# Patient Record
Sex: Female | Born: 1945 | Race: White | Hispanic: No | Marital: Married | Smoking: Never smoker
Health system: Southern US, Community
[De-identification: ages and names within clinical notes are randomized; demographics above are authoritative.]

## PROBLEM LIST (undated history)

## (undated) DIAGNOSIS — C50919 Malignant neoplasm of unspecified site of unspecified female breast: Secondary | ICD-10-CM

## (undated) DIAGNOSIS — I10 Essential (primary) hypertension: Secondary | ICD-10-CM

## (undated) DIAGNOSIS — Z9889 Other specified postprocedural states: Secondary | ICD-10-CM

## (undated) DIAGNOSIS — L719 Rosacea, unspecified: Secondary | ICD-10-CM

## (undated) DIAGNOSIS — E785 Hyperlipidemia, unspecified: Secondary | ICD-10-CM

## (undated) HISTORY — DX: Malignant neoplasm of unspecified site of unspecified female breast: C50.919

## (undated) HISTORY — PX: BREAST MAMMOSITE: SHX5264

## (undated) HISTORY — DX: Other specified postprocedural states: Z98.890

## (undated) HISTORY — DX: Hyperlipidemia, unspecified: E78.5

## (undated) HISTORY — PX: BREAST LUMPECTOMY: SHX2

## (undated) HISTORY — DX: Essential (primary) hypertension: I10

## (undated) HISTORY — DX: Rosacea, unspecified: L71.9

---

## 2001-06-05 ENCOUNTER — Other Ambulatory Visit: Admission: RE | Admit: 2001-06-05 | Discharge: 2001-06-05 | Payer: Self-pay | Admitting: Obstetrics and Gynecology

## 2001-12-25 ENCOUNTER — Ambulatory Visit (HOSPITAL_COMMUNITY): Admission: RE | Admit: 2001-12-25 | Discharge: 2001-12-25 | Payer: Self-pay | Admitting: Gastroenterology

## 2002-06-06 ENCOUNTER — Other Ambulatory Visit: Admission: RE | Admit: 2002-06-06 | Discharge: 2002-06-06 | Payer: Self-pay | Admitting: Obstetrics and Gynecology

## 2003-06-25 ENCOUNTER — Other Ambulatory Visit: Admission: RE | Admit: 2003-06-25 | Discharge: 2003-06-25 | Payer: Self-pay | Admitting: Obstetrics and Gynecology

## 2003-09-04 ENCOUNTER — Emergency Department (HOSPITAL_COMMUNITY): Admission: EM | Admit: 2003-09-04 | Discharge: 2003-09-04 | Payer: Self-pay | Admitting: Emergency Medicine

## 2004-08-30 HISTORY — PX: BREAST LUMPECTOMY: SHX2

## 2005-06-08 ENCOUNTER — Encounter: Admission: RE | Admit: 2005-06-08 | Discharge: 2005-06-08 | Payer: Self-pay | Admitting: Internal Medicine

## 2005-07-30 ENCOUNTER — Encounter: Admission: RE | Admit: 2005-07-30 | Discharge: 2005-07-30 | Payer: Self-pay | Admitting: Surgery

## 2005-08-26 ENCOUNTER — Encounter (INDEPENDENT_AMBULATORY_CARE_PROVIDER_SITE_OTHER): Payer: Self-pay | Admitting: Specialist

## 2005-08-26 ENCOUNTER — Ambulatory Visit (HOSPITAL_BASED_OUTPATIENT_CLINIC_OR_DEPARTMENT_OTHER): Admission: RE | Admit: 2005-08-26 | Discharge: 2005-08-26 | Payer: Self-pay | Admitting: Surgery

## 2005-08-26 ENCOUNTER — Ambulatory Visit (HOSPITAL_COMMUNITY): Admission: RE | Admit: 2005-08-26 | Discharge: 2005-08-26 | Payer: Self-pay | Admitting: Surgery

## 2005-09-06 ENCOUNTER — Ambulatory Visit: Payer: Self-pay | Admitting: Oncology

## 2005-09-07 ENCOUNTER — Ambulatory Visit: Admission: RE | Admit: 2005-09-07 | Discharge: 2005-11-15 | Payer: Self-pay | Admitting: Radiation Oncology

## 2005-10-12 ENCOUNTER — Ambulatory Visit (HOSPITAL_COMMUNITY): Admission: RE | Admit: 2005-10-12 | Discharge: 2005-10-12 | Payer: Self-pay | Admitting: Surgery

## 2005-10-12 ENCOUNTER — Encounter (INDEPENDENT_AMBULATORY_CARE_PROVIDER_SITE_OTHER): Payer: Self-pay | Admitting: *Deleted

## 2005-11-12 ENCOUNTER — Ambulatory Visit: Payer: Self-pay | Admitting: Oncology

## 2006-02-08 ENCOUNTER — Ambulatory Visit: Payer: Self-pay | Admitting: Oncology

## 2006-02-10 LAB — CBC WITH DIFFERENTIAL/PLATELET
BASO%: 0.5 % (ref 0.0–2.0)
Basophils Absolute: 0 10*3/uL (ref 0.0–0.1)
EOS%: 3.7 % (ref 0.0–7.0)
HGB: 13.2 g/dL (ref 11.6–15.9)
MCH: 31.4 pg (ref 26.0–34.0)
MCHC: 34 g/dL (ref 32.0–36.0)
MCV: 92.4 fL (ref 81.0–101.0)
MONO%: 7.2 % (ref 0.0–13.0)
RBC: 4.21 10*6/uL (ref 3.70–5.32)
RDW: 14.3 % (ref 11.3–14.5)
lymph#: 1.9 10*3/uL (ref 0.9–3.3)

## 2006-02-11 LAB — COMPREHENSIVE METABOLIC PANEL
ALT: 19 U/L (ref 0–40)
AST: 21 U/L (ref 0–37)
Albumin: 4 g/dL (ref 3.5–5.2)
Alkaline Phosphatase: 48 U/L (ref 39–117)
Calcium: 9.3 mg/dL (ref 8.4–10.5)
Chloride: 102 mEq/L (ref 96–112)
Potassium: 4.6 mEq/L (ref 3.5–5.3)
Sodium: 142 mEq/L (ref 135–145)
Total Protein: 7.3 g/dL (ref 6.0–8.3)

## 2006-02-11 LAB — CANCER ANTIGEN 27.29: CA 27.29: 21 U/mL (ref 0–39)

## 2006-05-10 ENCOUNTER — Ambulatory Visit: Payer: Self-pay | Admitting: Oncology

## 2006-05-12 LAB — CBC WITH DIFFERENTIAL/PLATELET
Basophils Absolute: 0 10*3/uL (ref 0.0–0.1)
Eosinophils Absolute: 0.3 10*3/uL (ref 0.0–0.5)
HGB: 12.9 g/dL (ref 11.6–15.9)
MCV: 92.4 fL (ref 81.0–101.0)
MONO%: 11.1 % (ref 0.0–13.0)
NEUT#: 3.4 10*3/uL (ref 1.5–6.5)
RDW: 13.6 % (ref 11.3–14.5)
lymph#: 2.4 10*3/uL (ref 0.9–3.3)

## 2006-05-12 LAB — COMPREHENSIVE METABOLIC PANEL
ALT: 18 U/L (ref 0–40)
Albumin: 4.2 g/dL (ref 3.5–5.2)
CO2: 27 mEq/L (ref 19–32)
Calcium: 9.2 mg/dL (ref 8.4–10.5)
Chloride: 106 mEq/L (ref 96–112)
Creatinine, Ser: 0.81 mg/dL (ref 0.40–1.20)
Potassium: 4.2 mEq/L (ref 3.5–5.3)

## 2006-05-12 LAB — LACTATE DEHYDROGENASE: LDH: 137 U/L (ref 94–250)

## 2006-05-12 LAB — CANCER ANTIGEN 27.29: CA 27.29: 25 U/mL (ref 0–39)

## 2006-07-28 ENCOUNTER — Ambulatory Visit: Admission: RE | Admit: 2006-07-28 | Discharge: 2006-08-09 | Payer: Self-pay | Admitting: Radiation Oncology

## 2006-07-28 LAB — CBC WITH DIFFERENTIAL/PLATELET
Basophils Absolute: 0 10*3/uL (ref 0.0–0.1)
EOS%: 5.4 % (ref 0.0–7.0)
Eosinophils Absolute: 0.4 10*3/uL (ref 0.0–0.5)
HGB: 13.4 g/dL (ref 11.6–15.9)
MCH: 31.7 pg (ref 26.0–34.0)
NEUT#: 3.7 10*3/uL (ref 1.5–6.5)
RDW: 13.5 % (ref 11.3–14.5)
WBC: 7.2 10*3/uL (ref 3.9–10.0)
lymph#: 2.4 10*3/uL (ref 0.9–3.3)

## 2006-11-08 ENCOUNTER — Ambulatory Visit: Payer: Self-pay | Admitting: Oncology

## 2006-11-11 LAB — CBC WITH DIFFERENTIAL/PLATELET
BASO%: 0.3 % (ref 0.0–2.0)
Basophils Absolute: 0 10*3/uL (ref 0.0–0.1)
Eosinophils Absolute: 0.3 10*3/uL (ref 0.0–0.5)
HCT: 34.5 % — ABNORMAL LOW (ref 34.8–46.6)
HGB: 12 g/dL (ref 11.6–15.9)
MONO#: 0.7 10*3/uL (ref 0.1–0.9)
NEUT#: 4.2 10*3/uL (ref 1.5–6.5)
NEUT%: 52.8 % (ref 39.6–76.8)
WBC: 7.9 10*3/uL (ref 3.9–10.0)
lymph#: 2.7 10*3/uL (ref 0.9–3.3)

## 2006-11-11 LAB — COMPREHENSIVE METABOLIC PANEL
ALT: 14 U/L (ref 0–35)
CO2: 25 mEq/L (ref 19–32)
Calcium: 9.1 mg/dL (ref 8.4–10.5)
Chloride: 106 mEq/L (ref 96–112)
Creatinine, Ser: 0.84 mg/dL (ref 0.40–1.20)

## 2006-11-11 LAB — LACTATE DEHYDROGENASE: LDH: 135 U/L (ref 94–250)

## 2006-11-11 LAB — CANCER ANTIGEN 27.29: CA 27.29: 20 U/mL (ref 0–39)

## 2007-02-10 ENCOUNTER — Ambulatory Visit: Payer: Self-pay | Admitting: Oncology

## 2007-02-14 LAB — LACTATE DEHYDROGENASE: LDH: 158 U/L (ref 94–250)

## 2007-02-14 LAB — CBC WITH DIFFERENTIAL/PLATELET
Eosinophils Absolute: 0.3 10*3/uL (ref 0.0–0.5)
HCT: 38.1 % (ref 34.8–46.6)
LYMPH%: 36.7 % (ref 14.0–48.0)
MCHC: 34.9 g/dL (ref 32.0–36.0)
MCV: 92.2 fL (ref 81.0–101.0)
MONO#: 0.3 10*3/uL (ref 0.1–0.9)
MONO%: 4.8 % (ref 0.0–13.0)
NEUT#: 3.7 10*3/uL (ref 1.5–6.5)
NEUT%: 53.3 % (ref 39.6–76.8)
Platelets: 255 10*3/uL (ref 145–400)
RBC: 4.13 10*6/uL (ref 3.70–5.32)
WBC: 7 10*3/uL (ref 3.9–10.0)

## 2007-02-14 LAB — COMPREHENSIVE METABOLIC PANEL
Alkaline Phosphatase: 47 U/L (ref 39–117)
CO2: 23 mEq/L (ref 19–32)
Creatinine, Ser: 0.76 mg/dL (ref 0.40–1.20)
Glucose, Bld: 140 mg/dL — ABNORMAL HIGH (ref 70–99)
Sodium: 143 mEq/L (ref 135–145)
Total Bilirubin: 0.4 mg/dL (ref 0.3–1.2)

## 2007-02-14 LAB — CANCER ANTIGEN 27.29: CA 27.29: 20 U/mL (ref 0–39)

## 2007-05-25 ENCOUNTER — Encounter: Admission: RE | Admit: 2007-05-25 | Discharge: 2007-05-25 | Payer: Self-pay | Admitting: Oncology

## 2007-06-29 ENCOUNTER — Ambulatory Visit: Payer: Self-pay | Admitting: Oncology

## 2007-07-03 LAB — CANCER ANTIGEN 27.29: CA 27.29: 19 U/mL (ref 0–39)

## 2007-07-03 LAB — COMPREHENSIVE METABOLIC PANEL
ALT: 25 U/L (ref 0–35)
Alkaline Phosphatase: 66 U/L (ref 39–117)
Creatinine, Ser: 0.84 mg/dL (ref 0.40–1.20)
Glucose, Bld: 170 mg/dL — ABNORMAL HIGH (ref 70–99)
Sodium: 142 mEq/L (ref 135–145)
Total Bilirubin: 0.4 mg/dL (ref 0.3–1.2)
Total Protein: 7.3 g/dL (ref 6.0–8.3)

## 2007-07-03 LAB — CBC WITH DIFFERENTIAL/PLATELET
BASO%: 0.1 % (ref 0.0–2.0)
Eosinophils Absolute: 0.2 10*3/uL (ref 0.0–0.5)
HCT: 38.1 % (ref 34.8–46.6)
HGB: 13.3 g/dL (ref 11.6–15.9)
LYMPH%: 33.8 % (ref 14.0–48.0)
MONO#: 0.3 10*3/uL (ref 0.1–0.9)
MONO%: 4.8 % (ref 0.0–13.0)
NEUT#: 3.8 10*3/uL (ref 1.5–6.5)
RDW: 14 % (ref 11.3–14.5)
WBC: 6.5 10*3/uL (ref 3.9–10.0)
lymph#: 2.2 10*3/uL (ref 0.9–3.3)

## 2007-11-02 ENCOUNTER — Ambulatory Visit: Payer: Self-pay | Admitting: Oncology

## 2007-11-06 LAB — CBC WITH DIFFERENTIAL/PLATELET
BASO%: 0.5 % (ref 0.0–2.0)
EOS%: 3.7 % (ref 0.0–7.0)
Eosinophils Absolute: 0.3 10*3/uL (ref 0.0–0.5)
LYMPH%: 35.8 % (ref 14.0–48.0)
MCHC: 34.6 g/dL (ref 32.0–36.0)
MCV: 89.9 fL (ref 81.0–101.0)
MONO%: 7.1 % (ref 0.0–13.0)
NEUT#: 4 10*3/uL (ref 1.5–6.5)
Platelets: 236 10*3/uL (ref 145–400)
RBC: 4.25 10*6/uL (ref 3.70–5.32)
RDW: 13.9 % (ref 11.3–14.5)

## 2007-11-07 LAB — COMPREHENSIVE METABOLIC PANEL
ALT: 18 U/L (ref 0–35)
AST: 19 U/L (ref 0–37)
Albumin: 4.2 g/dL (ref 3.5–5.2)
Alkaline Phosphatase: 79 U/L (ref 39–117)
Glucose, Bld: 92 mg/dL (ref 70–99)
Potassium: 4.5 mEq/L (ref 3.5–5.3)
Sodium: 140 mEq/L (ref 135–145)
Total Bilirubin: 0.4 mg/dL (ref 0.3–1.2)
Total Protein: 7 g/dL (ref 6.0–8.3)

## 2007-11-07 LAB — VITAMIN D 25 HYDROXY (VIT D DEFICIENCY, FRACTURES): Vit D, 25-Hydroxy: 40 ng/mL (ref 30–89)

## 2007-11-09 LAB — VITAMIN D 1,25 DIHYDROXY: Vit D, 1,25-Dihydroxy: 41 pg/mL (ref 6–62)

## 2008-03-04 ENCOUNTER — Ambulatory Visit: Payer: Self-pay | Admitting: Oncology

## 2008-03-06 LAB — COMPREHENSIVE METABOLIC PANEL
AST: 24 U/L (ref 0–37)
Albumin: 4.6 g/dL (ref 3.5–5.2)
Alkaline Phosphatase: 89 U/L (ref 39–117)
BUN: 18 mg/dL (ref 6–23)
Calcium: 10.3 mg/dL (ref 8.4–10.5)
Chloride: 104 mEq/L (ref 96–112)
Glucose, Bld: 85 mg/dL (ref 70–99)
Potassium: 4.4 mEq/L (ref 3.5–5.3)
Sodium: 140 mEq/L (ref 135–145)
Total Protein: 7.9 g/dL (ref 6.0–8.3)

## 2008-03-06 LAB — CBC WITH DIFFERENTIAL/PLATELET
BASO%: 0.4 % (ref 0.0–2.0)
Basophils Absolute: 0 10*3/uL (ref 0.0–0.1)
HCT: 39.7 % (ref 34.8–46.6)
HGB: 13.7 g/dL (ref 11.6–15.9)
MONO#: 0.7 10*3/uL (ref 0.1–0.9)
NEUT%: 53.2 % (ref 39.6–76.8)
RDW: 14 % (ref 11.3–14.5)
WBC: 7.8 10*3/uL (ref 3.9–10.0)
lymph#: 2.7 10*3/uL (ref 0.9–3.3)

## 2008-07-05 ENCOUNTER — Ambulatory Visit: Payer: Self-pay | Admitting: Oncology

## 2008-07-09 LAB — COMPREHENSIVE METABOLIC PANEL
ALT: 18 U/L (ref 0–35)
CO2: 26 mEq/L (ref 19–32)
Calcium: 10 mg/dL (ref 8.4–10.5)
Chloride: 101 mEq/L (ref 96–112)
Creatinine, Ser: 0.93 mg/dL (ref 0.40–1.20)
Glucose, Bld: 98 mg/dL (ref 70–99)
Total Bilirubin: 0.5 mg/dL (ref 0.3–1.2)

## 2008-07-09 LAB — CANCER ANTIGEN 27.29: CA 27.29: 24 U/mL (ref 0–39)

## 2008-07-09 LAB — CBC WITH DIFFERENTIAL/PLATELET
Basophils Absolute: 0 10*3/uL (ref 0.0–0.1)
Eosinophils Absolute: 0.2 10*3/uL (ref 0.0–0.5)
HCT: 38.8 % (ref 34.8–46.6)
HGB: 13.2 g/dL (ref 11.6–15.9)
LYMPH%: 34.7 % (ref 14.0–48.0)
MCHC: 34.1 g/dL (ref 32.0–36.0)
MONO#: 0.5 10*3/uL (ref 0.1–0.9)
NEUT#: 3.7 10*3/uL (ref 1.5–6.5)
NEUT%: 55.4 % (ref 39.6–76.8)
Platelets: 248 10*3/uL (ref 145–400)
WBC: 6.7 10*3/uL (ref 3.9–10.0)
lymph#: 2.3 10*3/uL (ref 0.9–3.3)

## 2008-07-09 LAB — LACTATE DEHYDROGENASE: LDH: 159 U/L (ref 94–250)

## 2008-11-01 ENCOUNTER — Ambulatory Visit: Payer: Self-pay | Admitting: Oncology

## 2008-11-06 LAB — CBC WITH DIFFERENTIAL/PLATELET
BASO%: 0.5 % (ref 0.0–2.0)
Eosinophils Absolute: 0.2 10*3/uL (ref 0.0–0.5)
MCV: 91.7 fL (ref 79.5–101.0)
MONO#: 0.5 10*3/uL (ref 0.1–0.9)
MONO%: 6.7 % (ref 0.0–14.0)
NEUT#: 4.4 10*3/uL (ref 1.5–6.5)
RBC: 4.21 10*6/uL (ref 3.70–5.45)
RDW: 14.1 % (ref 11.2–14.5)
WBC: 7.6 10*3/uL (ref 3.9–10.3)

## 2008-11-08 LAB — COMPREHENSIVE METABOLIC PANEL
ALT: 24 U/L (ref 0–35)
AST: 25 U/L (ref 0–37)
Albumin: 4.2 g/dL (ref 3.5–5.2)
Alkaline Phosphatase: 90 U/L (ref 39–117)
Glucose, Bld: 109 mg/dL — ABNORMAL HIGH (ref 70–99)
Potassium: 4.3 mEq/L (ref 3.5–5.3)
Sodium: 141 mEq/L (ref 135–145)
Total Protein: 7.2 g/dL (ref 6.0–8.3)

## 2008-11-08 LAB — CANCER ANTIGEN 27.29: CA 27.29: 20 U/mL (ref 0–39)

## 2008-11-08 LAB — VITAMIN D 25 HYDROXY (VIT D DEFICIENCY, FRACTURES): Vit D, 25-Hydroxy: 70 ng/mL (ref 30–89)

## 2009-04-28 ENCOUNTER — Ambulatory Visit: Payer: Self-pay | Admitting: Oncology

## 2009-04-30 LAB — CBC WITH DIFFERENTIAL/PLATELET
BASO%: 0.4 % (ref 0.0–2.0)
Basophils Absolute: 0 10*3/uL (ref 0.0–0.1)
EOS%: 3.2 % (ref 0.0–7.0)
Eosinophils Absolute: 0.2 10*3/uL (ref 0.0–0.5)
HCT: 38.3 % (ref 34.8–46.6)
HGB: 13.1 g/dL (ref 11.6–15.9)
LYMPH%: 31.1 % (ref 14.0–49.7)
MCH: 31.4 pg (ref 25.1–34.0)
MCHC: 34.1 g/dL (ref 31.5–36.0)
MCV: 92.1 fL (ref 79.5–101.0)
MONO#: 0.5 10*3/uL (ref 0.1–0.9)
MONO%: 8.1 % (ref 0.0–14.0)
NEUT#: 3.7 10*3/uL (ref 1.5–6.5)
NEUT%: 57.2 % (ref 38.4–76.8)
Platelets: 213 10*3/uL (ref 145–400)
RBC: 4.16 10*6/uL (ref 3.70–5.45)
RDW: 14.6 % — ABNORMAL HIGH (ref 11.2–14.5)
WBC: 6.5 10*3/uL (ref 3.9–10.3)
lymph#: 2 10*3/uL (ref 0.9–3.3)

## 2009-04-30 LAB — CANCER ANTIGEN 27.29: CA 27.29: 22 U/mL (ref 0–39)

## 2009-05-01 LAB — COMPREHENSIVE METABOLIC PANEL
Albumin: 4 g/dL (ref 3.5–5.2)
BUN: 15 mg/dL (ref 6–23)
Calcium: 9.8 mg/dL (ref 8.4–10.5)
Chloride: 105 mEq/L (ref 96–112)
Glucose, Bld: 121 mg/dL — ABNORMAL HIGH (ref 70–99)
Potassium: 4.2 mEq/L (ref 3.5–5.3)

## 2009-06-06 ENCOUNTER — Encounter: Admission: RE | Admit: 2009-06-06 | Discharge: 2009-06-06 | Payer: Self-pay | Admitting: Oncology

## 2009-11-10 ENCOUNTER — Ambulatory Visit: Payer: Self-pay | Admitting: Oncology

## 2009-11-12 LAB — CBC WITH DIFFERENTIAL/PLATELET
BASO%: 0.7 % (ref 0.0–2.0)
Basophils Absolute: 0.1 10*3/uL (ref 0.0–0.1)
EOS%: 3.1 % (ref 0.0–7.0)
Eosinophils Absolute: 0.2 10*3/uL (ref 0.0–0.5)
HCT: 38.8 % (ref 34.8–46.6)
HGB: 13.2 g/dL (ref 11.6–15.9)
LYMPH%: 34.6 % (ref 14.0–49.7)
MCH: 31.9 pg (ref 25.1–34.0)
MCHC: 34 g/dL (ref 31.5–36.0)
MCV: 93.9 fL (ref 79.5–101.0)
MONO#: 0.8 10*3/uL (ref 0.1–0.9)
MONO%: 11.2 % (ref 0.0–14.0)
NEUT#: 3.7 10*3/uL (ref 1.5–6.5)
NEUT%: 50.4 % (ref 38.4–76.8)
Platelets: 238 10*3/uL (ref 145–400)
RBC: 4.14 10*6/uL (ref 3.70–5.45)
RDW: 14 % (ref 11.2–14.5)
WBC: 7.4 10*3/uL (ref 3.9–10.3)
lymph#: 2.6 10*3/uL (ref 0.9–3.3)

## 2009-11-13 LAB — COMPREHENSIVE METABOLIC PANEL
ALT: 23 U/L (ref 0–35)
AST: 23 U/L (ref 0–37)
Albumin: 4.1 g/dL (ref 3.5–5.2)
Alkaline Phosphatase: 77 U/L (ref 39–117)
BUN: 20 mg/dL (ref 6–23)
CO2: 24 mEq/L (ref 19–32)
Calcium: 9.2 mg/dL (ref 8.4–10.5)
Chloride: 103 mEq/L (ref 96–112)
Creatinine, Ser: 0.88 mg/dL (ref 0.40–1.20)
Glucose, Bld: 75 mg/dL (ref 70–99)
Potassium: 4.3 mEq/L (ref 3.5–5.3)
Sodium: 138 mEq/L (ref 135–145)
Total Bilirubin: 0.5 mg/dL (ref 0.3–1.2)
Total Protein: 7.1 g/dL (ref 6.0–8.3)

## 2009-11-13 LAB — CANCER ANTIGEN 27.29: CA 27.29: 27 U/mL (ref 0–39)

## 2009-11-13 LAB — LACTATE DEHYDROGENASE: LDH: 161 U/L (ref 94–250)

## 2009-11-13 LAB — VITAMIN D 25 HYDROXY (VIT D DEFICIENCY, FRACTURES): Vit D, 25-Hydroxy: 54 ng/mL (ref 30–89)

## 2010-01-15 ENCOUNTER — Ambulatory Visit: Payer: Self-pay | Admitting: Genetic Counselor

## 2010-09-24 ENCOUNTER — Other Ambulatory Visit: Payer: Self-pay | Admitting: Obstetrics and Gynecology

## 2010-10-08 ENCOUNTER — Ambulatory Visit: Payer: Medicare Other | Attending: Radiation Oncology | Admitting: Radiation Oncology

## 2010-11-12 ENCOUNTER — Other Ambulatory Visit: Payer: Self-pay | Admitting: Oncology

## 2010-11-12 ENCOUNTER — Encounter (HOSPITAL_BASED_OUTPATIENT_CLINIC_OR_DEPARTMENT_OTHER): Payer: Medicare Other | Admitting: Oncology

## 2010-11-12 DIAGNOSIS — C50419 Malignant neoplasm of upper-outer quadrant of unspecified female breast: Secondary | ICD-10-CM

## 2010-11-12 LAB — CBC WITH DIFFERENTIAL/PLATELET
BASO%: 0.5 % (ref 0.0–2.0)
Basophils Absolute: 0 10*3/uL (ref 0.0–0.1)
EOS%: 3 % (ref 0.0–7.0)
Eosinophils Absolute: 0.2 10*3/uL (ref 0.0–0.5)
HCT: 40.4 % (ref 34.8–46.6)
HGB: 13.6 g/dL (ref 11.6–15.9)
LYMPH%: 37 % (ref 14.0–49.7)
MCHC: 33.7 g/dL (ref 31.5–36.0)
MCV: 91 fL (ref 79.5–101.0)
MONO#: 0.6 10*3/uL (ref 0.1–0.9)
MONO%: 7.8 % (ref 0.0–14.0)
NEUT#: 4 10*3/uL (ref 1.5–6.5)
NEUT%: 51.7 % (ref 38.4–76.8)
Platelets: 269 10*3/uL (ref 145–400)
RDW: 13.9 % (ref 11.2–14.5)
WBC: 7.7 10*3/uL (ref 3.9–10.3)
lymph#: 2.9 10*3/uL (ref 0.9–3.3)

## 2010-11-13 LAB — COMPREHENSIVE METABOLIC PANEL
AST: 34 U/L (ref 0–37)
Albumin: 4.4 g/dL (ref 3.5–5.2)
BUN: 17 mg/dL (ref 6–23)
CO2: 25 mEq/L (ref 19–32)
Calcium: 9.5 mg/dL (ref 8.4–10.5)
Chloride: 102 mEq/L (ref 96–112)
Creatinine, Ser: 0.85 mg/dL (ref 0.40–1.20)
Glucose, Bld: 93 mg/dL (ref 70–99)
Potassium: 4 mEq/L (ref 3.5–5.3)
Sodium: 139 mEq/L (ref 135–145)
Total Bilirubin: 0.5 mg/dL (ref 0.3–1.2)
Total Protein: 7.6 g/dL (ref 6.0–8.3)

## 2010-11-13 LAB — LACTATE DEHYDROGENASE: LDH: 178 U/L (ref 94–250)

## 2010-11-13 LAB — CANCER ANTIGEN 27.29: CA 27.29: 21 U/mL (ref 0–39)

## 2010-11-19 ENCOUNTER — Encounter (HOSPITAL_BASED_OUTPATIENT_CLINIC_OR_DEPARTMENT_OTHER): Payer: Medicare Other | Admitting: Oncology

## 2010-11-19 DIAGNOSIS — C50419 Malignant neoplasm of upper-outer quadrant of unspecified female breast: Secondary | ICD-10-CM

## 2011-01-15 NOTE — Op Note (Signed)
NAMESHANNELL, MIKKELSEN                  ACCOUNT NO.:  [id number]   MEDICAL RECORD NO.:  [id number]          PATIENT TYPE:  AMB   LOCATION:  DAY                          FACILITY:  [facility]   PHYSICIAN:  Currie Paris, M.D.DATE OF BIRTH:  Jan 04, 1946   DATE OF PROCEDURE:  10/12/2005  DATE OF DISCHARGE:                                 OPERATIVE REPORT   PREOPERATIVE DIAGNOSIS:  Carcinoma right breast upper outer quadrant.   POSTOPERATIVE DIAGNOSIS:  Carcinoma right breast upper outer quadrant.   OPERATION:  Re-excision of prior lumpectomy cavity with placement of  MammoSite device.   SURGEON:  Dr. Jamey Ripa.   ANESTHESIA:  General.   CLINICAL HISTORY:  This patient is 6 weeks status post right lumpectomy for  a breast cancer and she elected to proceed to MammoSite placement. The delay  had allowed the seroma cavity to shrink quite small and we were not sure  what we would need to do to get the MammoSite placed.   DESCRIPTION OF PROCEDURE:  The patient was seen in the holding area and she  had no further questions. She was taken to the operating room and after  satisfactory general anesthesia had been obtained, the time-out occurred.   The breast was prepped and draped. I injected a little local in the skin and  then opened the lateral corner of the incision. We had ultrasounded it prior  to starting and saw what appeared to be a cavity fairly deep but looked like  it had some debris in it. I did enter a cavity which was fairly small and I  was able to break up some loculations with my finger and get a little old  bloody fluid out. I initially inserted the MammoSite through the corner of  the wound but clearly the cavity had shrunk so that there would not be room  enough for this and the MammoSite balloon would protrude out the lateral  edge of the incision.   At this point, I made a small counter incision laterally and using the  trocar device made a tunnel for the catheter and then  put the catheter into  the seroma cavity and inflated it to 35 mL. Unfortunately the MammoSite  continued to move somewhat laterally while it was in the cavity as the  balloon blew up because of persistence from the cavity walls and fibrosis.  The skin to balloon margin was only about 5 mm. At this point, I deflated  the catheter, reopened the corner of the incision, put a few more sutures in  deep and tried to blow this up again but again were unsuccessful in getting  a good distance. At this point, the MammoSite was removed and the old scar  opened its entire length. The seroma cavity which was fibrotic was entered  and the walls of this excised making a new cavity that I thought would  easily accommodate the balloon. Bleeding was controlled. Everything appeared  to be dry. I then placed a MammoSite back in the cavity through the lateral  incision. The incision was closed in  layers with 3-0 Vicryl on the subcu  producing what I thought would be an adequate distance of subcu closure over  the balloon. The skin was closed with nylon. The balloon was then inflated  to 35 mL.  This got good conformance to the cavity and we had at least 15 mm  skin to balloon distance.   At this point, sterile dressings were applied. The patient tolerated the  procedure well. There were no complications. All counts were correct.      Currie Paris, M.D.  Electronically Signed     CJS/MEDQ  D:  10/12/2005  T:  10/13/2005  Job:  [id number]   cc:   Artist Pais Kathrynn Running, M.D.  Fax: [phone]

## 2011-01-15 NOTE — Procedures (Signed)
[facility]  Patient:    Erica Newman, Erica Newman Visit Number: [id number] MRN: [id number]          Service Type: END Location: ENDO Attending Physician:  Orland Mustard Dictated by:   Llana Aliment. Randa Evens, M.D. Proc. Date: 12/25/01 Admit Date:  12/25/2001   CC:         Antony Madura, M.D.   Procedure Report  DATE OF BIRTH:  Jun 16, 1946  PROCEDURE:  Colonoscopy.  MEDICATIONS:  Fentanyl 100 mcg, Versed 10 mg IV.  SCOPE:  Olympus pediatric video colonoscope.  INDICATION:  A strong family history of colon cancer in grandparents.  DESCRIPTION OF PROCEDURE:  The procedure had been explained to the patient and consent obtained.  The patient in the left lateral decubitus position, the Olympus pediatric video colonoscope inserted and advanced under direct visualization.  The prep was excellent.  We were able to advance to the cecum without difficulty,  The ileocecal valve and appendiceal orifice seen.  The scope withdrawn, and the cecum, ascending colon, hepatic flexure, transverse colon, splenic flexure, descending and sigmoid colon were seen well upon removal.  No polyps or other lesions seen.  No significant diverticular disease.  The scope withdrawn.  The patient tolerated the procedure well.  ASSESSMENT:  Family history of colon cancer with normal colonoscopy.  PLAN:  We will recommend repeating in five years and recommend yearly Hemoccults. Dictated by:   Llana Aliment. Randa Evens, M.D. Attending Physician:  Orland Mustard DD:  12/25/01 TD:  12/25/01 Job: [id number]

## 2011-01-29 ENCOUNTER — Encounter (INDEPENDENT_AMBULATORY_CARE_PROVIDER_SITE_OTHER): Payer: Self-pay | Admitting: Surgery

## 2011-01-29 DIAGNOSIS — I10 Essential (primary) hypertension: Secondary | ICD-10-CM | POA: Insufficient documentation

## 2011-01-29 DIAGNOSIS — E7800 Pure hypercholesterolemia, unspecified: Secondary | ICD-10-CM | POA: Insufficient documentation

## 2011-01-29 DIAGNOSIS — Z973 Presence of spectacles and contact lenses: Secondary | ICD-10-CM | POA: Insufficient documentation

## 2011-01-29 DIAGNOSIS — L719 Rosacea, unspecified: Secondary | ICD-10-CM | POA: Insufficient documentation

## 2011-01-29 DIAGNOSIS — C50919 Malignant neoplasm of unspecified site of unspecified female breast: Secondary | ICD-10-CM | POA: Insufficient documentation

## 2011-02-03 ENCOUNTER — Encounter (INDEPENDENT_AMBULATORY_CARE_PROVIDER_SITE_OTHER): Payer: Self-pay | Admitting: Surgery

## 2011-07-19 ENCOUNTER — Encounter: Payer: Self-pay | Admitting: Surgery

## 2011-08-06 ENCOUNTER — Telehealth: Payer: Self-pay | Admitting: *Deleted

## 2011-08-06 NOTE — Telephone Encounter (Signed)
left voice message to inform the patient of the new date and time on 11-12-2011 at 10:00 lab only 11-19-2011 at 11:00 dr. Donnie Coffin

## 2011-08-20 ENCOUNTER — Other Ambulatory Visit: Payer: Self-pay | Admitting: *Deleted

## 2011-10-19 ENCOUNTER — Telehealth: Payer: Self-pay | Admitting: *Deleted

## 2011-10-19 NOTE — Telephone Encounter (Signed)
gave patient appointment for 10-25-2011 lab only 11-03-2011 at see dr.rubin 9:30am patient confirmed over the phone

## 2011-10-25 ENCOUNTER — Other Ambulatory Visit (HOSPITAL_BASED_OUTPATIENT_CLINIC_OR_DEPARTMENT_OTHER): Payer: Medicare Other

## 2011-10-25 DIAGNOSIS — M949 Disorder of cartilage, unspecified: Secondary | ICD-10-CM

## 2011-10-25 LAB — CBC WITH DIFFERENTIAL/PLATELET
Basophils Absolute: 0.1 10*3/uL (ref 0.0–0.1)
Eosinophils Absolute: 0.3 10*3/uL (ref 0.0–0.5)
HCT: 42.4 % (ref 34.8–46.6)
LYMPH%: 32.2 % (ref 14.0–49.7)
MCV: 93.4 fL (ref 79.5–101.0)
MONO#: 0.5 10*3/uL (ref 0.1–0.9)
MONO%: 7.2 % (ref 0.0–14.0)
NEUT#: 4.2 10*3/uL (ref 1.5–6.5)
NEUT%: 55.9 % (ref 38.4–76.8)
Platelets: 287 10*3/uL (ref 145–400)
RBC: 4.54 10*6/uL (ref 3.70–5.45)
WBC: 7.6 10*3/uL (ref 3.9–10.3)

## 2011-10-25 LAB — COMPREHENSIVE METABOLIC PANEL
Alkaline Phosphatase: 59 U/L (ref 39–117)
BUN: 14 mg/dL (ref 6–23)
CO2: 30 mEq/L (ref 19–32)
Creatinine, Ser: 0.86 mg/dL (ref 0.50–1.10)
Glucose, Bld: 98 mg/dL (ref 70–99)
Total Bilirubin: 0.4 mg/dL (ref 0.3–1.2)
Total Protein: 8 g/dL (ref 6.0–8.3)

## 2011-10-26 LAB — VITAMIN D 25 HYDROXY (VIT D DEFICIENCY, FRACTURES): Vit D, 25-Hydroxy: 52 ng/mL (ref 30–89)

## 2011-10-27 ENCOUNTER — Other Ambulatory Visit: Payer: Medicare Other | Admitting: Lab

## 2011-11-03 ENCOUNTER — Telehealth: Payer: Self-pay | Admitting: Oncology

## 2011-11-03 ENCOUNTER — Ambulatory Visit (HOSPITAL_BASED_OUTPATIENT_CLINIC_OR_DEPARTMENT_OTHER): Payer: Medicare Other | Admitting: Oncology

## 2011-11-03 VITALS — BP 130/82 | HR 90 | Temp 98.3°F | Ht 61.5 in | Wt 143.3 lb

## 2011-11-03 DIAGNOSIS — Z853 Personal history of malignant neoplasm of breast: Secondary | ICD-10-CM

## 2011-11-03 DIAGNOSIS — E559 Vitamin D deficiency, unspecified: Secondary | ICD-10-CM

## 2011-11-03 DIAGNOSIS — C50919 Malignant neoplasm of unspecified site of unspecified female breast: Secondary | ICD-10-CM

## 2011-11-03 NOTE — Telephone Encounter (Signed)
gve the pt her feb,march 2014 appt calendar

## 2011-11-03 NOTE — Patient Instructions (Signed)
Stay off femara Continue vitamin D and fosamax

## 2011-11-03 NOTE — Progress Notes (Signed)
Hematology and Oncology Follow Up Visit  Erica Newman [id number] 11-21-1945 66 y.o. 11/03/2011 10:27 AM PCP  Principle Diagnosis: 66 year old with history of T1 B. N0 breast cancer status post lumpectomy and lymph node evaluation in December 2006, followed by MammoSite therapy completed 3 2007 status post 5 years of hormonal therapy  Interim History:  There have been no intercurrent illness, hospitalizations or medication changes. Since being seen last she essentially has stopped her Femara and has been off this for a year. She feels pretty well has no current complaints. She has had recent blood work with her primary care Dr. She continues on generic Fosamax calcium and vitamin D replacement.  Medications: I have reviewed the patient's current medications.  Allergies:  Allergies  Allergen Reactions  . Sulfa Antibiotics     Ask patient to clarify type and severity of reaction, and if possible specific Sulfa medication if any. Detail not noted on medical history form dated 12/18/10.    Past Medical History, Surgical history, Social history, and Family History were reviewed and updated.  Review of Systems: Constitutional:  Negative for fever, chills, night sweats, anorexia, weight loss, pain. Cardiovascular: no chest pain or dyspnea on exertion Respiratory: no cough, shortness of breath, or wheezing Neurological: negative Dermatological: negative ENT: negative Skin Gastrointestinal: negative Genito-Urinary: negative Hematological and Lymphatic: negative Breast: negative Musculoskeletal: negative Remaining ROS negative.  Physical Exam: Blood pressure 130/82, pulse 90, temperature 98.3 F (36.8 C), temperature source Oral, height 5' 1.5" (1.562 m), weight 143 lb 4.8 oz (65 kg). ECOG: 0 General appearance: alert, cooperative and appears stated age Head: Normocephalic, without obvious abnormality, atraumatic Neck: no adenopathy, no carotid bruit, no JVD, supple, symmetrical, trachea  midline and thyroid not enlarged, symmetric, no tenderness/mass/nodules Lymph nodes: Cervical, supraclavicular, and axillary nodes normal. Cardiac : regular rate and rhythm, no murmurs or gallops Pulmonary:clear to auscultation bilaterally and normal percussion bilaterally Breasts: inspection negative, no nipple discharge or bleeding, no masses or nodularity palpable Abdomen:soft, non-tender; bowel sounds normal; no masses,  no organomegaly Extremities negative Neuro: alert, oriented, normal speech, no focal findings or movement disorder noted  Lab Results: Lab Results  Component Value Date   WBC 7.6 10/25/2011   HGB 14.3 10/25/2011   HCT 42.4 10/25/2011   MCV 93.4 10/25/2011   PLT 287 10/25/2011     Chemistry      Component Value Date/Time   NA 139 10/25/2011 1520   K 3.6 10/25/2011 1520   CL 101 10/25/2011 1520   CO2 30 10/25/2011 1520   BUN 14 10/25/2011 1520   CREATININE 0.86 10/25/2011 1520      Component Value Date/Time   CALCIUM 10.1 10/25/2011 1520   ALKPHOS 59 10/25/2011 1520   AST 29 10/25/2011 1520   ALT 40* 10/25/2011 1520   BILITOT 0.4 10/25/2011 1520      .pathology. Radiological Studies: chest X-ray n/a Mammogram 08/12- wnl Bone density 06/12- nl bd  Impression and Plan: The patient is a pleasant postmenopausal woman who presents for yearly evaluation. She has been off her Femara for a year. We discussed whether or not I thought she should go back on this and I feel that she does not necessarily need to do so. Her overall performance status is excellent. I would  recommend annual followup with imaging studies. Her vitamin D level is excellent as well.  More than 50% of the visit was spent in patient-related counselling   Pierce Crane, MD 3/6/201310:27 AM

## 2011-11-12 ENCOUNTER — Other Ambulatory Visit: Payer: Medicare Other | Admitting: Lab

## 2011-11-19 ENCOUNTER — Ambulatory Visit: Payer: Medicare Other | Admitting: Oncology

## 2011-12-20 ENCOUNTER — Encounter (INDEPENDENT_AMBULATORY_CARE_PROVIDER_SITE_OTHER): Payer: Self-pay | Admitting: General Surgery

## 2011-12-21 ENCOUNTER — Ambulatory Visit (INDEPENDENT_AMBULATORY_CARE_PROVIDER_SITE_OTHER): Payer: Medicare Other | Admitting: Surgery

## 2011-12-21 ENCOUNTER — Encounter (INDEPENDENT_AMBULATORY_CARE_PROVIDER_SITE_OTHER): Payer: Self-pay | Admitting: Surgery

## 2011-12-21 VITALS — BP 146/82 | HR 64 | Resp 20 | Ht 61.5 in | Wt 143.0 lb

## 2011-12-21 DIAGNOSIS — Z853 Personal history of malignant neoplasm of breast: Secondary | ICD-10-CM

## 2011-12-21 NOTE — Patient Instructions (Signed)
We will see you again on an as needed basis. Please call the office at [phone] if you have any questions or concerns. Thank you for allowing us to take care of you.  

## 2011-12-21 NOTE — Progress Notes (Signed)
NAME: [name]       DOB: 1946/02/02           DATE: 12/21/2011       MRN: [id number]   Erica Newman is a 66 y.o.Marland Kitchenfemale who presents for routine followup of her Right IDC diagnosed in 2006 and treated with lumpectomy and Mammostie radiation. She has no problems or concerns on either side.  PFSH: She has had no significant changes since the last visit here.  ROS: There have been no significant changes since the last visit here  EXAM: General: The patient is alert, oriented, generally healty appearing, NAD. Mood and affect are normal.  Breasts:  Right shows some firmness at the lumpectomy site, otherwise normal. The left is normal  Lymphatics: She has no axillary or supraclavicular adenopathy on either side.  Extremities: Full ROM of the surgical side with no lymphedema noted.  Data Reviewed: Mammogram negative  Impression: Doing well, with no evidence of recurrent cancer or new cancer  Plan: RTC PRN

## 2012-01-12 ENCOUNTER — Other Ambulatory Visit: Payer: Self-pay

## 2012-01-12 DIAGNOSIS — Z853 Personal history of malignant neoplasm of breast: Secondary | ICD-10-CM

## 2012-01-12 MED ORDER — ALENDRONATE SODIUM 35 MG PO TABS: 35.0000 mg | ORAL_TABLET | ORAL | Status: DC

## 2012-06-29 ENCOUNTER — Encounter (INDEPENDENT_AMBULATORY_CARE_PROVIDER_SITE_OTHER): Payer: Self-pay

## 2012-09-22 ENCOUNTER — Telehealth: Payer: Self-pay | Admitting: *Deleted

## 2012-09-22 ENCOUNTER — Encounter: Payer: Self-pay | Admitting: Oncology

## 2012-09-22 NOTE — Telephone Encounter (Signed)
Pt called about her appt since Dr. Donnie Coffin is no longer here.  Confirmed 11/03/12 appt w/ pt.  Mailed letter & calendar to pt.

## 2012-09-25 ENCOUNTER — Encounter: Payer: Self-pay | Admitting: Oncology

## 2012-10-19 ENCOUNTER — Telehealth: Payer: Self-pay | Admitting: *Deleted

## 2012-10-19 NOTE — Telephone Encounter (Signed)
Left message for a return phone call to reschedule appt.

## 2012-10-24 ENCOUNTER — Telehealth: Payer: Self-pay | Admitting: *Deleted

## 2012-10-24 NOTE — Telephone Encounter (Signed)
Received call back from patient to reschedule her appt.  Confirmed appt. For 11/09/12 at 3pm with Bernell List.Then will become Dr. Darnelle Catalan.

## 2012-10-27 ENCOUNTER — Other Ambulatory Visit: Payer: Medicare Other | Admitting: Lab

## 2012-11-03 ENCOUNTER — Ambulatory Visit: Payer: Medicare Other | Admitting: Oncology

## 2012-11-03 ENCOUNTER — Ambulatory Visit: Payer: BC Managed Care – PPO | Admitting: Physician Assistant

## 2012-11-09 ENCOUNTER — Encounter: Payer: Self-pay | Admitting: Family

## 2012-11-09 ENCOUNTER — Ambulatory Visit (HOSPITAL_BASED_OUTPATIENT_CLINIC_OR_DEPARTMENT_OTHER): Payer: Medicare Other | Admitting: Family

## 2012-11-09 ENCOUNTER — Telehealth: Payer: Self-pay | Admitting: Oncology

## 2012-11-09 VITALS — BP 132/75 | HR 79 | Temp 98.3°F | Resp 20 | Ht 60.0 in | Wt 141.6 lb

## 2012-11-09 DIAGNOSIS — M899 Disorder of bone, unspecified: Secondary | ICD-10-CM

## 2012-11-09 NOTE — Progress Notes (Signed)
[facility]  [phone] [phone]  OFFICE PROGRESS NOTE   ID: ASTRA GREGG   DOB: [birth year]  MR#: [id number]  JYN#:[id number]   PCP: Lorenda Peck, MD GYN: Lynden Ang, NP-C SU:  Currie Paris, MD RAD ONCArtist Pais.  Kathrynn Running, MD   HISTORY OF PRESENT ILLNESS: From Dr. Theron Arista Rubin's new patient evaluation dated 09/15/2005: "This is a pleasant 67 year old woman from [place], referred by Dr. Jamey Ripa for evaluation and treatment of breast cancer.  This woman has been in good health all her life.  She undergoes routine screening mammography on an annual basis.  She did undergo a mammogram on September 06, 2004.  This essentially showed a mass in the right breast.  Compression views July 07, 2005, with ultrasound showed a 0.5 x 0.6 x 0.5 cm mass.  Core biopsy was recommended and performed on July 21, 2005.  This showed an intermediate grade invasive mammary adenocarcinoma, ductal type, ER and PR positive at 97% and 46% respectively.  Proliferative index 27%, HER-2 was 1+.  Ms. Keady underwent SGI breast specific gamma imaging, which showed focal isotope uptake in the right breast, no other abnormalities.  MRI scan of both breasts was performed on July 30, 2005.  Essentially this showed a small mass measuring 1.1 x 1.1 x 0.8 cm in the upper central portion of the right breast.  No other abnormalities were seen.  Baseline bone density was done on July 21, 2005, which showed mild osteopenia involving the femoral left neck.  Ms. Muscato underwent lumpectomy and sentinel lymph node evaluation on August 26, 2005.  Pathology showed a .7 cm. invasive tumor with clear surgical margins.  Lymphovascular invasion was not seen.  The tumor was grade II of III.  Two sentinel lymph nodes were identified, both of which were negative for malignancy.  Ms. Mclees had a rather unremarkable postoperative course."  Her subsequent history is as detailed  below.  INTERVAL HISTORY: Dr. Darnelle Catalan and I saw Mrs. Jonna Coup today for follow-up invasive ductal carcinoma.She was last seen by Dr. Donnie Coffin on 11/03/2011. Since her last office visits the patient states that she's been doing well. She is establishing herself with Dr. Darrall Dears service today.  REVIEW OF SYSTEMS: A 10 point review of systems was completed and is negative. The patient denies any symptomatology.   PAST MEDICAL HISTORY: Past Medical History  Diagnosis Date  . History of lumpectomy     Due to breast cancer 12/06. Per medical history form dated 12/18/10.  Marland Kitchen Hyperlipidemia     Controlled with medication per medical history from dated 12/18/10.  Marland Kitchen Hypertension     Per medical history form dated 12/18/10.  . Breast cancer   . Rosacea      PAST SURGICAL HISTORY: Past Surgical History  Procedure Laterality Date  . Breast lumpectomy      Performed 12/06 per medical history form dated 12/18/10.  . Breast mammosite      Performed 09/2005 per medical history form dated 12/18/10.  Marland Kitchen Cesarean section  1979  . Breast lumpectomy  2006    FAMILY HISTORY Family History  Problem Relation Age of Onset  . Heart disease Mother     Died of heart attack at age 67.  Marland Kitchen COPD Father     Died at age 17.  . Cancer Maternal Aunt     Breast cancer  . Cancer Paternal Aunt     Breast cancer     GYNECOLOGIC  HISTORY: She is gravida 4, para 2.  Menarche [age].  Birth control pills for 20 or 30 years.  Menopause in mid-50's.  She was on an estrogen patch for six months or less.  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 She has been married for over 40 years to her husband who works in the Scientific laboratory technician.  She has two adult children, a daughter who lives in [place], [place], and a son who lives in [place], [place]. She is four grandchildren.  ADVANCED DIRECTIVES:  Not on file  HEALTH MAINTENANCE: History  Substance Use Topics  . Smoking status:  Never Smoker   . Smokeless tobacco: Never Used  . Alcohol Use: Yes     Comment: Glass of wine - occasional.    Colonoscopy:  Not on file PAP:  09/24/2010 Bone density:  02/17/2011 with the T score of -1.6 (osteopenia). Lipid panel:  08/07/2012  Allergies  Allergen Reactions  . Sulfa Antibiotics     Ask patient to clarify type and severity of reaction, and if possible specific Sulfa medication if any. Detail not noted on medical history form dated 12/18/10.    Current Outpatient Prescriptions  Medication Sig Dispense Refill  . alendronate (FOSAMAX) 35 MG tablet Take 1 tablet (35 mg total) by mouth every 7 (seven) days. Take with a full glass of water on an empty stomach.  12 tablet  4  . aspirin 81 MG tablet Take 81 mg by mouth daily.      Marland Kitchen atenolol (TENORMIN) 25 MG tablet Take 50 mg by mouth daily. Per medical history form dated 12/18/10.      . Brimonidine Tartrate (MIRVASO) 0.33 % GEL Apply 1 application topically daily. Apply to affected area once daily for 30 days.      . Calcium Citrate-Vitamin D (CALCIUM CITRATE + D PO) Take by mouth.        . Cholecalciferol (VITAMIN D) 1000 UNITS capsule Take 1,000 Units by mouth daily.        . clobetasol (TEMOVATE) 0.05 % external solution Apply 1 application topically daily as needed.      . doxycycline (ORACEA) 40 MG capsule Take 40 mg by mouth every morning.      Marland Kitchen lisinopril (PRINIVIL,ZESTRIL) 2.5 MG tablet       . lovastatin (MEVACOR) 20 MG tablet Take 20 mg by mouth at bedtime. Per medical history form dated 12/18/10.       No current facility-administered medications for this visit.    OBJECTIVE: Filed Vitals:   11/09/12 1512  BP: 132/75  Pulse: 79  Temp: 98.3 F (36.8 C)  Resp: 20     Body mass index is 27.65 kg/(m^2).      ECOG FS:  Grade 0 - Fully active             General appearance: Alert, cooperative, well nourished, no apparent distress Head: Normocephalic, without obvious abnormality, atraumatic Eyes: Arcus  senilis, PERRLA, EOMI Nose: Nares, septum and mucosa are normal, no drainage or sinus tenderness Neck: No adenopathy, supple, symmetrical, trachea midline, thyroid not enlarged, no tenderness Resp: Clear to auscultation bilaterally Cardio: Regular rate and rhythm, S1, S2 normal, no murmur, click, rub or gallop Breasts: Pendulous bilaterally, right breast well-healed surgical scar, right breast firmness at 12 o'clock position, no lymphadenopathy, contracted right nipple, no axilla fullness, benign breast exam GI: Soft, distended, non-tender, hypoactive bowel sounds, no organomegaly Extremities: Extremities normal, atraumatic, no cyanosis or edema Lymph nodes: Cervical,  supraclavicular, and axillary nodes normal Neurologic: Grossly normal   LAB RESULTS: Lab Results  Component Value Date   WBC 7.6 10/25/2011   NEUTROABS 4.2 10/25/2011   HGB 14.3 10/25/2011   HCT 42.4 10/25/2011   MCV 93.4 10/25/2011   PLT 287 10/25/2011      Chemistry      Component Value Date/Time   NA 139 10/25/2011 1520   K 3.6 10/25/2011 1520   CL 101 10/25/2011 1520   CO2 30 10/25/2011 1520   BUN 14 10/25/2011 1520   CREATININE 0.86 10/25/2011 1520      Component Value Date/Time   CALCIUM 10.1 10/25/2011 1520   ALKPHOS 59 10/25/2011 1520   AST 29 10/25/2011 1520   ALT 40* 10/25/2011 1520   BILITOT 0.4 10/25/2011 1520     The patient provided laboratory reports from her primary care physician's office that were drawn on 08/07/2012, the values are as follows:  WBC 7.0, RBC 4.50, hemoglobin 14.1, hematocrit 42.1, MCV 93.6, ANC 3.6, sodium 139, potassium 4.7, chloride 102, glucose 103, BUN 12, creatinine 0.79, total bilirubin 0.7, AST 20, ALT 20, total protein 7.5, albumin 4.3, calcium 10.3, cholesterol 152, triglyceride 204, HDL 49, LDL 62, T4 10.1, TSH 1.911, hemoglobin A1C 6 .1, microalbumin 0.50.    Lab Results  Component Value Date   LABCA2 21 11/12/2010    Urinalysis No results found for this basename:  colorurine,  appearanceur,  labspec,  phurine,  glucoseu,  hgbur,  bilirubinur,  ketonesur,  proteinur,  urobilinogen,  nitrite,  leukocytesur    STUDIES: No results found.  ASSESSMENT: 67 y.o.  [place]:  1. Status post  Right breast lumpectomy with sentinel node biopsy on 08/26/2005 for a stage IA, T1b and N0, invasive ductal carcinoma, here 97%, PR 46%, Ki-67 27%, HER2/neu no amplification,grade 2,  0/2 positive lymph nodes.  2. Status post radiation therapy with Mammosite from 10/14/2005 through 10/20/2005.  3. Status post 5 years of antiestrogen therapy consisting of 1 year of Tamoxifen and 4 years of Femara.  4. Osteopenia  PLAN: 1.The patient's last mammogram was on 06/28/2012 which did not show any evidence of any new or worrisome findings. The patient will be due for another annual mammogram in 05/2013. The patient has her mammograms [facility] at [facility].  2. The patient will continue Fosamax every 7days, in addition to calcium citrate and vitamin D for osteopenia. An electronic prescription for Fosamax was sent to the patient's pharmacy #[id number] with 4 refills ordered.  The patient is due for another bone density scan this year.  3. We plan to see the patient again in one year at which time we will check a CBC and CMP.  All questions were answered.  The patient was encouraged to contact us in the interim with any problems, questions or concerns.   Larina Bras, NP-C 11/11/2012, 11:06 PM

## 2012-11-09 NOTE — Telephone Encounter (Signed)
gv pt appt schedule for March 2015.  °

## 2012-11-09 NOTE — Patient Instructions (Addendum)
Please contact us at [phone] if you have any questions or concerns. 

## 2013-03-12 ENCOUNTER — Other Ambulatory Visit: Payer: Self-pay | Admitting: *Deleted

## 2013-03-12 DIAGNOSIS — Z853 Personal history of malignant neoplasm of breast: Secondary | ICD-10-CM

## 2013-03-12 MED ORDER — ALENDRONATE SODIUM 35 MG PO TABS: 35.0000 mg | ORAL_TABLET | ORAL | Status: AC

## 2013-03-21 ENCOUNTER — Telehealth: Payer: Self-pay | Admitting: *Deleted

## 2013-03-21 NOTE — Telephone Encounter (Signed)
Patient called to verify results received. Scan has been seen and reviewed by Dr Darnelle Catalan and patient is to continue same plan of care with calcium and Fosamax. Patient verbalized understanding.

## 2013-11-07 ENCOUNTER — Other Ambulatory Visit: Payer: Self-pay | Admitting: *Deleted

## 2013-11-07 DIAGNOSIS — C50919 Malignant neoplasm of unspecified site of unspecified female breast: Secondary | ICD-10-CM

## 2013-11-08 ENCOUNTER — Other Ambulatory Visit (HOSPITAL_BASED_OUTPATIENT_CLINIC_OR_DEPARTMENT_OTHER): Payer: Medicare Other

## 2013-11-08 ENCOUNTER — Ambulatory Visit (HOSPITAL_BASED_OUTPATIENT_CLINIC_OR_DEPARTMENT_OTHER): Payer: Medicare Other | Admitting: Oncology

## 2013-11-08 VITALS — BP 137/80 | HR 79 | Temp 98.2°F | Resp 18 | Ht 60.0 in | Wt 128.3 lb

## 2013-11-08 DIAGNOSIS — M949 Disorder of cartilage, unspecified: Secondary | ICD-10-CM

## 2013-11-08 DIAGNOSIS — C50919 Malignant neoplasm of unspecified site of unspecified female breast: Secondary | ICD-10-CM

## 2013-11-08 DIAGNOSIS — M899 Disorder of bone, unspecified: Secondary | ICD-10-CM

## 2013-11-08 DIAGNOSIS — Z853 Personal history of malignant neoplasm of breast: Secondary | ICD-10-CM

## 2013-11-08 DIAGNOSIS — I10 Essential (primary) hypertension: Secondary | ICD-10-CM

## 2013-11-08 LAB — COMPREHENSIVE METABOLIC PANEL (CC13)
ALT: 25 U/L (ref 0–55)
ANION GAP: 12 meq/L — AB (ref 3–11)
AST: 21 U/L (ref 5–34)
Albumin: 4.2 g/dL (ref 3.5–5.0)
Alkaline Phosphatase: 50 U/L (ref 40–150)
BUN: 12.9 mg/dL (ref 7.0–26.0)
CALCIUM: 10.2 mg/dL (ref 8.4–10.4)
CHLORIDE: 102 meq/L (ref 98–109)
CO2: 26 meq/L (ref 22–29)
CREATININE: 0.8 mg/dL (ref 0.6–1.1)
Glucose: 157 mg/dl — ABNORMAL HIGH (ref 70–140)
Potassium: 3.9 mEq/L (ref 3.5–5.1)
Sodium: 140 mEq/L (ref 136–145)
Total Bilirubin: 0.52 mg/dL (ref 0.20–1.20)
Total Protein: 8.1 g/dL (ref 6.4–8.3)

## 2013-11-08 LAB — CBC WITH DIFFERENTIAL/PLATELET
BASO%: 0.8 % (ref 0.0–2.0)
Basophils Absolute: 0.1 10*3/uL (ref 0.0–0.1)
EOS%: 1.9 % (ref 0.0–7.0)
Eosinophils Absolute: 0.1 10*3/uL (ref 0.0–0.5)
HCT: 43.3 % (ref 34.8–46.6)
HEMOGLOBIN: 14.4 g/dL (ref 11.6–15.9)
LYMPH%: 30.5 % (ref 14.0–49.7)
MCH: 30.8 pg (ref 25.1–34.0)
MCHC: 33.2 g/dL (ref 31.5–36.0)
MCV: 92.8 fL (ref 79.5–101.0)
MONO#: 0.5 10*3/uL (ref 0.1–0.9)
MONO%: 6.7 % (ref 0.0–14.0)
NEUT#: 4.7 10*3/uL (ref 1.5–6.5)
NEUT%: 60.1 % (ref 38.4–76.8)
PLATELETS: 262 10*3/uL (ref 145–400)
RBC: 4.66 10*6/uL (ref 3.70–5.45)
RDW: 13.3 % (ref 11.2–14.5)
WBC: 7.9 10*3/uL (ref 3.9–10.3)
lymph#: 2.4 10*3/uL (ref 0.9–3.3)

## 2013-11-08 NOTE — Progress Notes (Signed)
[place]  [phone] [phone]) [phone]  OFFICE PROGRESS NOTE   ID: Erica Newman   DOB: Oct 11, 1945  MR#: [id number]  DEY#:[id number]   PCP: Myriam Jacobson, MD GYN: Avon Gully, NP-C SU:  Haywood Lasso, MD RAD ONCSheral Apley.  Tammi Klippel, MD   HISTORY OF PRESENT ILLNESS: From Dr. Collier Salina Newman's new patient evaluation dated 09/15/2005: "This is a pleasant 68 year old woman from [place], referred by Dr. Margot Newman for evaluation and treatment of breast cancer.  This woman has been in good health all her life.  She undergoes routine screening mammography on an annual basis.  She did undergo a mammogram on September 06, 2004.  This essentially showed a mass in the right breast.  Compression views July 07, 2005, with ultrasound showed a 0.5 x 0.6 x 0.5 cm mass.  Core biopsy was recommended and performed on July 21, 2005.  This showed an intermediate grade invasive mammary adenocarcinoma, ductal type, ER and PR positive at 97% and 46% respectively.  Proliferative index 27%, HER-2 was 1+.  Ms. Erica Newman underwent SGI breast specific gamma imaging, which showed focal isotope uptake in the right breast, no other abnormalities.  MRI scan of both breasts was performed on July 30, 2005.  Essentially this showed a small mass measuring 1.1 x 1.1 x 0.8 cm in the upper central portion of the right breast.  No other abnormalities were seen.  Baseline bone density was done on July 21, 2005, which showed mild osteopenia involving the femoral left neck.  Ms. Erica Newman underwent lumpectomy and sentinel lymph node evaluation on August 26, 2005.  Pathology showed a .7 cm. invasive tumor with clear surgical margins.  Lymphovascular invasion was not seen.  The tumor was grade II of III.  Two sentinel lymph nodes were identified, both of which were negative for malignancy.  Ms. Erica Newman had a rather unremarkable postoperative course."  Her subsequent history is as detailed  below.  INTERVAL HISTORY: Erica Newman returns today for followup of her remote breast cancer. The interval history is generally unremarkable. She is incredibly busy planning a reunion for you Erica Newman. She and her husband recently participated in his 50th reunion at [facility]. They brought the whole family from [place] to be there.  REVIEW OF SYSTEMS: She exercises by walking some and does yoga rarely. She denies any unusual headaches, visual changes, cough, phlegm production, or pleurisy. His been no change in bowel or bladder habits. A detailed review of systems today was noncontributory   PAST MEDICAL HISTORY: Past Medical History  Diagnosis Date  . History of lumpectomy     Due to breast cancer 12/06. Per medical history form dated 12/18/10.  Marland Kitchen Hyperlipidemia     Controlled with medication per medical history from dated 12/18/10.  Marland Kitchen Hypertension     Per medical history form dated 12/18/10.  . Breast cancer   . Rosacea      PAST SURGICAL HISTORY: Past Surgical History  Procedure Laterality Date  . Breast lumpectomy      Performed 12/06 per medical history form dated 12/18/10.  . Breast mammosite      Performed 09/2005 per medical history form dated 12/18/10.  Marland Kitchen Cesarean section  1979  . Breast lumpectomy  2006    FAMILY HISTORY Family History  Problem Relation Age of Onset  . Heart disease Mother     Died of heart attack at age 56.  Marland Kitchen COPD Father     Died at age 21.  Marland Kitchen  Cancer Maternal Aunt     Breast cancer  . Cancer Paternal Aunt     Breast cancer     GYNECOLOGIC HISTORY: She is gravida 4, para 2.  Menarche [age].  Birth control pills for 20 or 30 years.  Menopause in mid-50's.  She was on an estrogen patch for six months or less.  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 She has been married for over 40 years. Her husband works in the Audiological scientist.  She has two adult children, a daughter who lives in [place], [place], and a son who lives in  [place], [place]. She is four grandchildren.  ADVANCED DIRECTIVES:  Not on file  HEALTH MAINTENANCE: History  Substance Use Topics  . Smoking status: Never Smoker   . Smokeless tobacco: Never Used  . Alcohol Use: Yes     Comment: Glass of wine - occasional.    Colonoscopy:  Not on file PAP:  09/24/2010 Bone density:  02/17/2011 with the T score of -1.6 (osteopenia). Lipid panel:  08/07/2012  Allergies  Allergen Reactions  . Sulfa Antibiotics     Ask patient to clarify type and severity of reaction, and if possible specific Sulfa medication if any. Detail not noted on medical history form dated 12/18/10.    Current Outpatient Prescriptions  Medication Sig Dispense Refill  . alendronate (FOSAMAX) 35 MG tablet Take 1 tablet (35 mg total) by mouth every 7 (seven) days. Take with a full glass of water on an empty stomach.  12 tablet  2  . aspirin 81 MG tablet Take 81 mg by mouth daily.      Marland Kitchen atenolol (TENORMIN) 25 MG tablet Take 50 mg by mouth daily. Per medical history form dated 12/18/10.      . Brimonidine Tartrate (MIRVASO) 0.33 % GEL Apply 1 application topically daily. Apply to affected area once daily for 30 days.      . Calcium Citrate-Vitamin D (CALCIUM CITRATE + D PO) Take by mouth.        . Cholecalciferol (VITAMIN D) 1000 UNITS capsule Take 1,000 Units by mouth daily.        . clobetasol (TEMOVATE) 0.05 % external solution Apply 1 application topically daily as needed.      . doxycycline (ORACEA) 40 MG capsule Take 40 mg by mouth every morning.      Marland Kitchen lisinopril (PRINIVIL,ZESTRIL) 2.5 MG tablet       . lovastatin (MEVACOR) 20 MG tablet Take 20 mg by mouth at bedtime. Per medical history form dated 12/18/10.       No current facility-administered medications for this visit.    OBJECTIVE: Middle-aged white woman who appears well Filed Vitals:   11/08/13 1604  BP: 137/80  Pulse: 79  Temp: 98.2 F (36.8 C)  Resp: 18     Body mass index is 25.06 kg/(m^2).       ECOG FS:  Grade 0              Sclerae unicteric, pupils equal and reactive Oropharynx clear and moist- No cervical or supraclavicular adenopathy Lungs no rales or rhonchi, good excursion bilaterally Heart regular rate and rhythm, no murmur appreciated Abd soft, nontender, positive bowel sounds MSK no focal spinal tenderness, no upper extremity lymphedema Neuro: nonfocal, well oriented, appropriate affect Breasts: The right breast is status post lumpectomy. There is significant induration beneath the scar. This measures approximately 3 cm. There is no erythema. There are no skin or nipple  changes of concern. The right axilla is benign. The left breast is unremarkable.    LAB RESULTS: Lab Results  Component Value Date   WBC 7.9 11/08/2013   NEUTROABS 4.7 11/08/2013   HGB 14.4 11/08/2013   HCT 43.3 11/08/2013   MCV 92.8 11/08/2013   PLT 262 11/08/2013      Chemistry      Component Value Date/Time   NA 139 10/25/2011 1520   K 3.6 10/25/2011 1520   CL 101 10/25/2011 1520   CO2 30 10/25/2011 1520   BUN 14 10/25/2011 1520   CREATININE 0.86 10/25/2011 1520      Component Value Date/Time   CALCIUM 10.1 10/25/2011 1520   ALKPHOS 59 10/25/2011 1520   AST 29 10/25/2011 1520   ALT 40* 10/25/2011 1520   BILITOT 0.4 10/25/2011 1520      Lab Results  Component Value Date   LABCA2 21 11/12/2010    Urinalysis No results found for this basename: colorurine,  appearanceur,  labspec,  phurine,  glucoseu,  hgbur,  bilirubinur,  ketonesur,  proteinur,  urobilinogen,  nitrite,  leukocytesur    STUDIES: Mammography at [facility] 07/02/2013 was unremarkable.  ASSESSMENT: 68 y.o.  [place], [place] woman:  1. Status post  Right breast lumpectomy with sentinel node biopsy on 08/26/2005 for a stage IA, T1b  N0, invasive ductal carcinoma, grade 2, ER 97%, PR 46%, Ki-67 27%, HER2/neu no amplification  2. Status post radiation therapy with Mammosite from 10/14/2005 through 10/20/2005.  3. Status  post 5 years of antiestrogen therapy consisting of 1 year of Tamoxifen and 4 years of Femara.  4. Osteopenia  PLAN: And is now more than 8 years out from her definitive surgery. I am very comfortable releasing her to her primary care physician. She understands we keep her records another 10 years I will be glad to answer any questions she may have if she called [facility] or see her again if she finds anything suspicious that she would like me to evaluate. Other hand we are not making any more routine appointment for her here.  Of course you'll still need yearly mammography and a yearly physician breast exam indefinitely. Chauncey Cruel, MD  11/08/2013, 4:19 PM

## 2013-11-09 ENCOUNTER — Telehealth: Payer: Self-pay | Admitting: Oncology

## 2013-11-09 NOTE — Telephone Encounter (Signed)
Sent letter Dr. Lorene Dy office from Dr. Jana Hakim.

## 2013-11-09 NOTE — Telephone Encounter (Signed)
Letter sent to patient from Dr. Jana Hakim.

## 2013-11-09 NOTE — Addendum Note (Signed)
Addended by: Lannette Donath E on: 11/09/2013 12:29 PM   Modules accepted: Medications

## 2013-11-14 ENCOUNTER — Telehealth: Payer: Self-pay | Admitting: *Deleted

## 2013-11-14 NOTE — Telephone Encounter (Signed)
Received fax request for refill of fosamax from [facility].  Faxed back request and let them know to contact patient's PCP -Dr. Lorene Dy.  Patient has been released by Dr. Jana Hakim.

## 2015-02-20 ENCOUNTER — Other Ambulatory Visit: Payer: Self-pay | Admitting: Internal Medicine

## 2015-02-20 ENCOUNTER — Ambulatory Visit
Admission: RE | Admit: 2015-02-20 | Discharge: 2015-02-20 | Disposition: A | Discharge status: Home / Self Care | Payer: Medicare Other | Source: Ambulatory Visit | Attending: Internal Medicine | Admitting: Internal Medicine

## 2015-02-20 DIAGNOSIS — M7918 Myalgia, other site: Secondary | ICD-10-CM

## 2021-09-02 ENCOUNTER — Emergency Department (HOSPITAL_COMMUNITY): Payer: Medicare Other

## 2021-09-02 ENCOUNTER — Other Ambulatory Visit: Payer: Self-pay

## 2021-09-02 ENCOUNTER — Encounter (HOSPITAL_COMMUNITY): Payer: Self-pay | Admitting: Emergency Medicine

## 2021-09-02 ENCOUNTER — Emergency Department (HOSPITAL_COMMUNITY)
Admission: EM | Admit: 2021-09-02 | Discharge: 2021-09-02 | Disposition: A | Discharge status: Home / Self Care | Payer: Medicare Other | Attending: Emergency Medicine | Admitting: Emergency Medicine

## 2021-09-02 DIAGNOSIS — M5432 Sciatica, left side: Secondary | ICD-10-CM | POA: Diagnosis not present

## 2021-09-02 DIAGNOSIS — Z7982 Long term (current) use of aspirin: Secondary | ICD-10-CM | POA: Insufficient documentation

## 2021-09-02 DIAGNOSIS — M5442 Lumbago with sciatica, left side: Secondary | ICD-10-CM

## 2021-09-02 DIAGNOSIS — M545 Low back pain, unspecified: Secondary | ICD-10-CM | POA: Diagnosis present

## 2021-09-02 MED ORDER — HYDROCODONE-ACETAMINOPHEN 5-325 MG PO TABS: 1.0000 | ORAL_TABLET | Freq: Four times a day (QID) | ORAL | 0 refills | Status: AC | PRN

## 2021-09-02 MED ORDER — LIDOCAINE 5 % EX PTCH
1.0000 | MEDICATED_PATCH | CUTANEOUS | Status: DC
Start: 2021-09-02 — End: 2021-09-03
  Administered 2021-09-02: 1 via TRANSDERMAL
  Filled 2021-09-02: qty 1

## 2021-09-02 MED ORDER — LIDOCAINE 5 % EX PTCH
1.0000 | MEDICATED_PATCH | CUTANEOUS | 0 refills | Status: AC
Start: 2021-09-02 — End: ?

## 2021-09-02 MED ORDER — HYDROCODONE-ACETAMINOPHEN 5-325 MG PO TABS
1.0000 | ORAL_TABLET | Freq: Once | ORAL | Status: AC
  Administered 2021-09-02: 1 via ORAL
  Filled 2021-09-02: qty 1

## 2021-09-02 NOTE — ED Provider Triage Note (Signed)
Emergency Medicine Provider Triage Evaluation Note  Erica Newman , a 76 y.o. female  was evaluated in triage.  Pt complains of left low back pain radiating down left lower extremity laterally.  Denies falls or injuries.  States that she had a similar occurrence about 7 years ago which she thought was sciatica.  Reports onset of this episode sometime before Christmas, unable to say when, states that she has been busy with the holiday and taking care of everyone else and has put off taking care of herself.  Patient went to her PCP yesterday for her annual physical, mentioned her pain and was started on prednisone however feels like her pain is worse at this time and she needs a pain medication specifically. Thought processes is a bit disorganized, she acknowledges this and attributes that due to her pain, loss of sleep, concern for her current condition.  Review of Systems  Positive: Left lower back pain, radicular pain Negative: Abdominal pain, loss of bowel or bladder control, leg weakness or numbness  Physical Exam  BP (!) 168/95 (BP Location: Left Arm)    Pulse 86    Temp 97.8 F (36.6 C) (Oral)    Resp 18    Ht 5\' 2"  (1.575 m)    Wt 59.4 kg    SpO2 100%    BMI 23.96 kg/m  Gen:   Awake, no distress   Resp:  Normal effort  MSK:   Moves extremities without difficulty  Other:  No pain with palpation through her low back or SI joints  Medical Decision Making  Medically screening exam initiated at 4:38 PM.  Appropriate orders placed.  Erica Newman was informed that the remainder of the evaluation will be completed by another provider, this initial triage assessment does not replace that evaluation, and the importance of remaining in the ED until their evaluation is complete.     Tacy Learn, PA-C 09/02/21 1654

## 2021-09-02 NOTE — Discharge Instructions (Signed)
Take Norco as needed as prescribed for pain at night.  This medication can cause constipation.  Take Colace if needed.  Do not drive or operate machinery while taking Norco.  Apply Lidoderm patch as needed as prescribed.  Recheck with your doctor as planned.  You would likely benefit from physical therapy.

## 2021-09-02 NOTE — ED Provider Notes (Signed)
[facility] DEPT Provider Note   CSN: [id number] Arrival date & time: 09/02/21  1601     History  Chief Complaint  Patient presents with   Back Pain    Erica Newman is a 76 y.o. female.  Pt complains of left low back pain radiating down left lower extremity laterally.  Denies falls or injuries.  States that she had a similar occurrence about 7 years ago which she thought was sciatica.  Reports onset of this episode sometime before Christmas, unable to say when, states that she has been busy with the holiday and taking care of everyone else and has put off taking care of herself.  Patient went to her PCP yesterday for her annual physical, mentioned her pain and was started on prednisone however feels like her pain is worse at this time and she needs a pain medication specifically. Thought processes is a bit disorganized, she acknowledges this and attributes that due to her pain, loss of sleep, concern for her current condition.      Home Medications Prior to Admission medications   Medication Sig Start Date End Date Taking? Authorizing Provider  HYDROcodone-acetaminophen (NORCO/VICODIN) 5-325 MG tablet Take 1 tablet by mouth every 6 (six) hours as needed for moderate pain. 09/02/21  Yes Tacy Learn, PA-C  lidocaine (LIDODERM) 5 % Place 1 patch onto the skin daily. Remove & Discard patch within 12 hours or as directed by MD 09/02/21  Yes Tacy Learn, PA-C  alendronate (FOSAMAX) 35 MG tablet Take 1 tablet (35 mg total) by mouth every 7 (seven) days. Take with a full glass of water on an empty stomach. 03/12/13   Magrinat, Virgie Dad, MD  amphetamine-dextroamphetamine (ADDERALL) 20 MG tablet  10/24/13   [provider]  aspirin 81 MG tablet Take 81 mg by mouth daily.    [provider]  atenolol (TENORMIN) 25 MG tablet Take 50 mg by mouth daily. Per medical history form dated 12/18/10.    [provider]  Brimonidine Tartrate (MIRVASO)  0.33 % GEL Apply 1 application topically daily. Apply to affected area once daily for 30 days.    [provider]  Calcium Citrate-Vitamin D (CALCIUM CITRATE + D PO) Take by mouth.      [provider]  Cholecalciferol (VITAMIN D) 1000 UNITS capsule Take 1,000 Units by mouth daily.      [provider]  clobetasol (TEMOVATE) 0.05 % external solution Apply 1 application topically daily as needed.    [provider]  doxycycline (ORACEA) 40 MG capsule Take 40 mg by mouth every morning.    [provider]  lisinopril (PRINIVIL,ZESTRIL) 2.5 MG tablet  08/09/12   [provider]  lovastatin (MEVACOR) 20 MG tablet Take 20 mg by mouth at bedtime. Per medical history form dated 12/18/10.    [provider]      Allergies    Sulfa antibiotics    Review of Systems   Review of Systems  Constitutional:  Negative for chills and fever.  Respiratory:  Negative for shortness of breath.   Cardiovascular:  Negative for chest pain.  Gastrointestinal:  Negative for abdominal pain, constipation and diarrhea.  Genitourinary:  Negative for decreased urine volume and difficulty urinating.  Musculoskeletal:  Positive for back pain and myalgias. Negative for joint swelling, neck pain and neck stiffness.  Skin:  Negative for rash and wound.  Allergic/Immunologic: Negative for immunocompromised state.  Neurological:  Negative for weakness and numbness.  Hematological:  Negative for adenopathy.  Psychiatric/Behavioral:  Negative for confusion.    Physical Exam Updated Vital Signs BP (!) 162/88 (BP Location: Right Arm)    Pulse 98    Temp 98.2 F (36.8 C) (Oral)    Resp 16    Ht 5\' 2"  (1.575 m)    Wt 59.4 kg    SpO2 98%    BMI 23.96 kg/m  Physical Exam Vitals and nursing note reviewed.  Constitutional:      General: She is not in acute distress.    Appearance: She is well-developed. She is not diaphoretic.  HENT:     Head: Normocephalic and  atraumatic.  Cardiovascular:     Pulses: Normal pulses.  Pulmonary:     Effort: Pulmonary effort is normal.  Abdominal:     Palpations: Abdomen is soft.     Tenderness: There is no abdominal tenderness.  Musculoskeletal:        General: No swelling, tenderness, deformity or signs of injury.     Right lower leg: No edema.     Left lower leg: No edema.  Skin:    General: Skin is warm and dry.     Findings: No erythema or rash.  Neurological:     Mental Status: She is alert and oriented to person, place, and time.     Sensory: Sensation is intact. No sensory deficit.     Motor: No weakness.     Deep Tendon Reflexes: Reflexes normal.     Reflex Scores:      Patellar reflexes are 1+ on the right side and 1+ on the left side.      Achilles reflexes are 1+ on the right side and 1+ on the left side.    Comments: Symmetric plantar and dorsi flexion of the ankles.  Patient is ambulatory with limp favoring left leg.  Psychiatric:        Behavior: Behavior normal.    ED Results / Procedures / Treatments   Labs (all labs ordered are listed, but only abnormal results are displayed) Labs Reviewed - No data to display  EKG None  Radiology DG Lumbar Spine Complete  Result Date: 09/02/2021 CLINICAL DATA:  Low back and left leg pain. EXAM: LUMBAR SPINE - COMPLETE 4+ VIEW COMPARISON:  February 20, 2015. FINDINGS: Minimal grade 1 anterolisthesis of L4-5 is noted secondary to posterior facet joint hypertrophy. Moderate degenerative disc disease is noted at L1-2, L2-3 and L3-4. No fracture is noted. IMPRESSION: Moderate multilevel degenerative disc disease. No acute abnormality seen. Electronically Signed   By: Marijo Conception M.D.   On: 09/02/2021 17:31   DG Hip Unilat With Pelvis 2-3 Views Left  Result Date: 09/02/2021 CLINICAL DATA:  Acute left leg pain. EXAM: DG HIP (WITH OR WITHOUT PELVIS) 2-3V LEFT COMPARISON:  None. FINDINGS: There is no evidence of hip fracture or dislocation. No significant  joint space narrowing is noted. Osteophyte formation is noted. 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 No acute abnormality seen. Electronically Signed   By: Marijo Conception M.D.   On: 09/02/2021 17:30    Procedures Procedures    Medications Ordered in ED Medications  lidocaine (LIDODERM) 5 % 1 patch (1 patch Transdermal Patch Applied 09/02/21 1917)  HYDROcodone-acetaminophen (NORCO/VICODIN) 5-325 MG per tablet 1 tablet (1 tablet Oral Given 09/02/21 1939)    ED Course/ Medical Decision Making/ A&P Clinical Course as of 09/02/21 2345  Wed Sep 02, 2366  3910 76 year old female presents  with complaint of pain radiating from her left buttock/hip area down the left lateral leg to her ankle.  Onset of symptoms about a week and a half or so ago.  Patient has ignored her pain however it is gotten to the point where she feels she needs pain medication and Motrin and Tylenol are not helping.  Patient was seen by PCP yesterday for same, started on prednisone which she feels may be making her symptoms worse.  She denies falls or injuries, loss of bowel or bladder control, groin numbness, abdominal pain.  States that she has had sciatica in the past and this reminds her of her sciatica.  Patient does not have any midline or paraspinous tenderness through her back, no tenderness of the SI joint.  X-ray of the hip and lumbar spine obtained with degenerative changes noted.  Case discussed with Dr. Pearline Cables, ER attending who has examined the patient.  Agrees with plan for discharge home with pain medication.  Patient is scheduled to recheck with her doctor tomorrow.  Patient may benefit from advanced imaging versus referral to physical therapy.  Patient is not driving, was given dose of Norco prior to discharge as well as a Lidoderm patch.  Prescription for Norco and Lidoderm sent to patient's pharmacy. [LM]    Clinical Course User Index [LM] Tacy Learn, PA-C                           Medical  Decision Making         Final Clinical Impression(s) / ED Diagnoses Final diagnoses:  Acute right-sided low back pain with left-sided sciatica    Rx / DC Orders ED Discharge Orders          Ordered    HYDROcodone-acetaminophen (NORCO/VICODIN) 5-325 MG tablet  Every 6 hours PRN        09/02/21 1843    lidocaine (LIDODERM) 5 %  Every 24 hours        09/02/21 1843              Tacy Learn, PA-C 09/02/21 2345    Jeanell Sparrow, DO 09/05/21 0031

## 2021-09-02 NOTE — ED Triage Notes (Signed)
Patient reports L hip/ lower back pain that shoots down her L leg x1 week that got worse today. Was given prednisone from PCP, reports pain is worse today. Has taken tylenol, IBU and prednisone w/o relief.

## 2022-05-27 IMAGING — CR DG HIP (WITH OR WITHOUT PELVIS) 2-3V*L*
3 series · 3 of 3 positions shown · non-contrast
Comparison: None.

CLINICAL DATA: Acute left leg pain.

EXAM:
DG HIP (WITH OR WITHOUT PELVIS) 2-3V LEFT

[t pelvis ap]
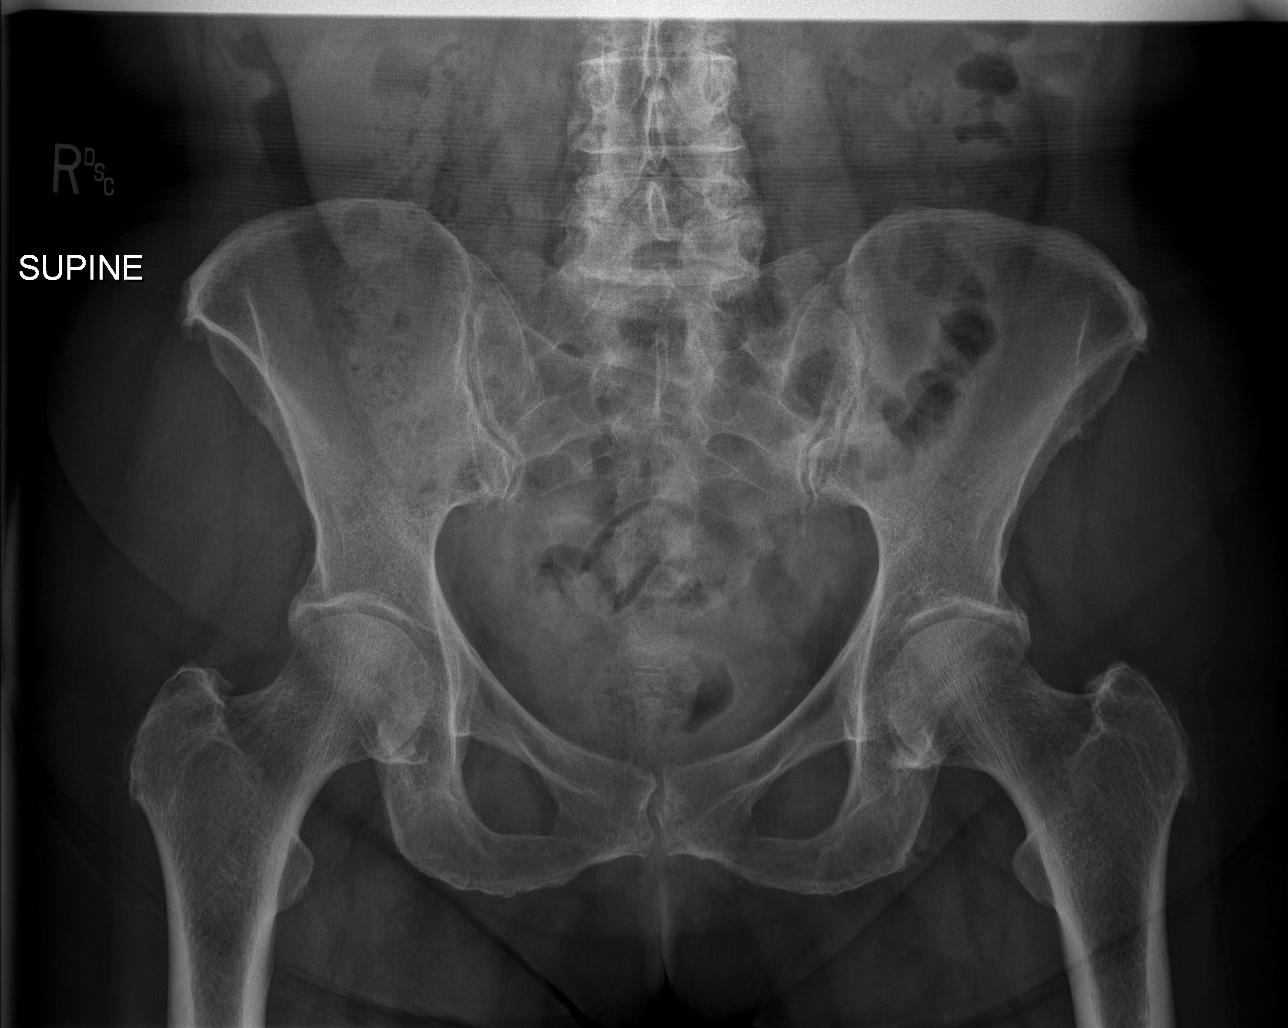

[t hip ap left]
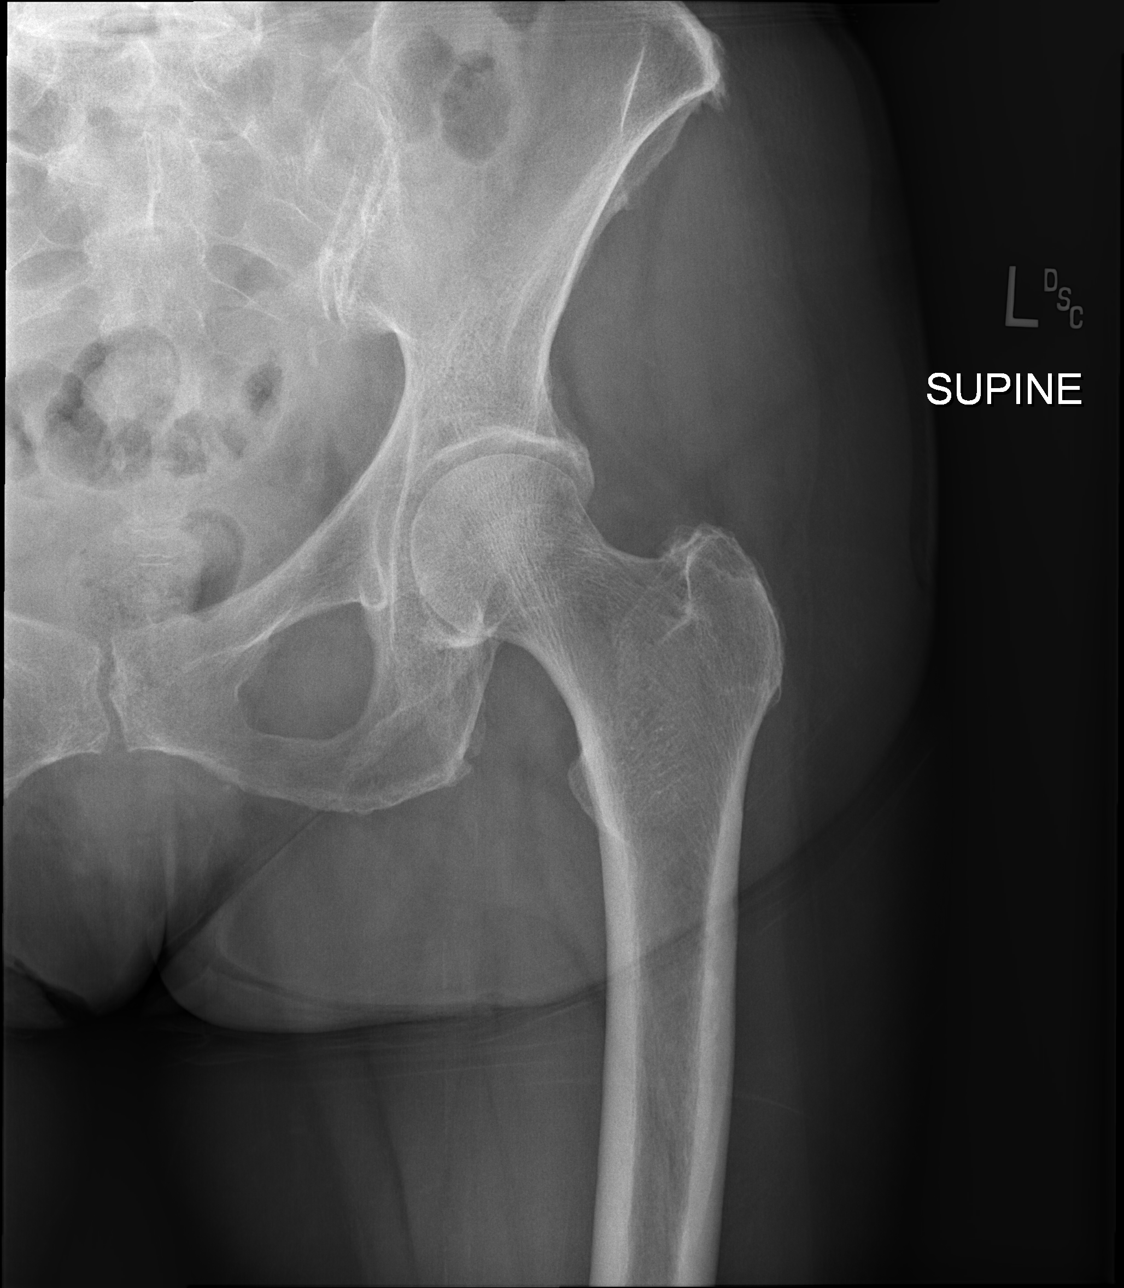

[t hip frog leg left]
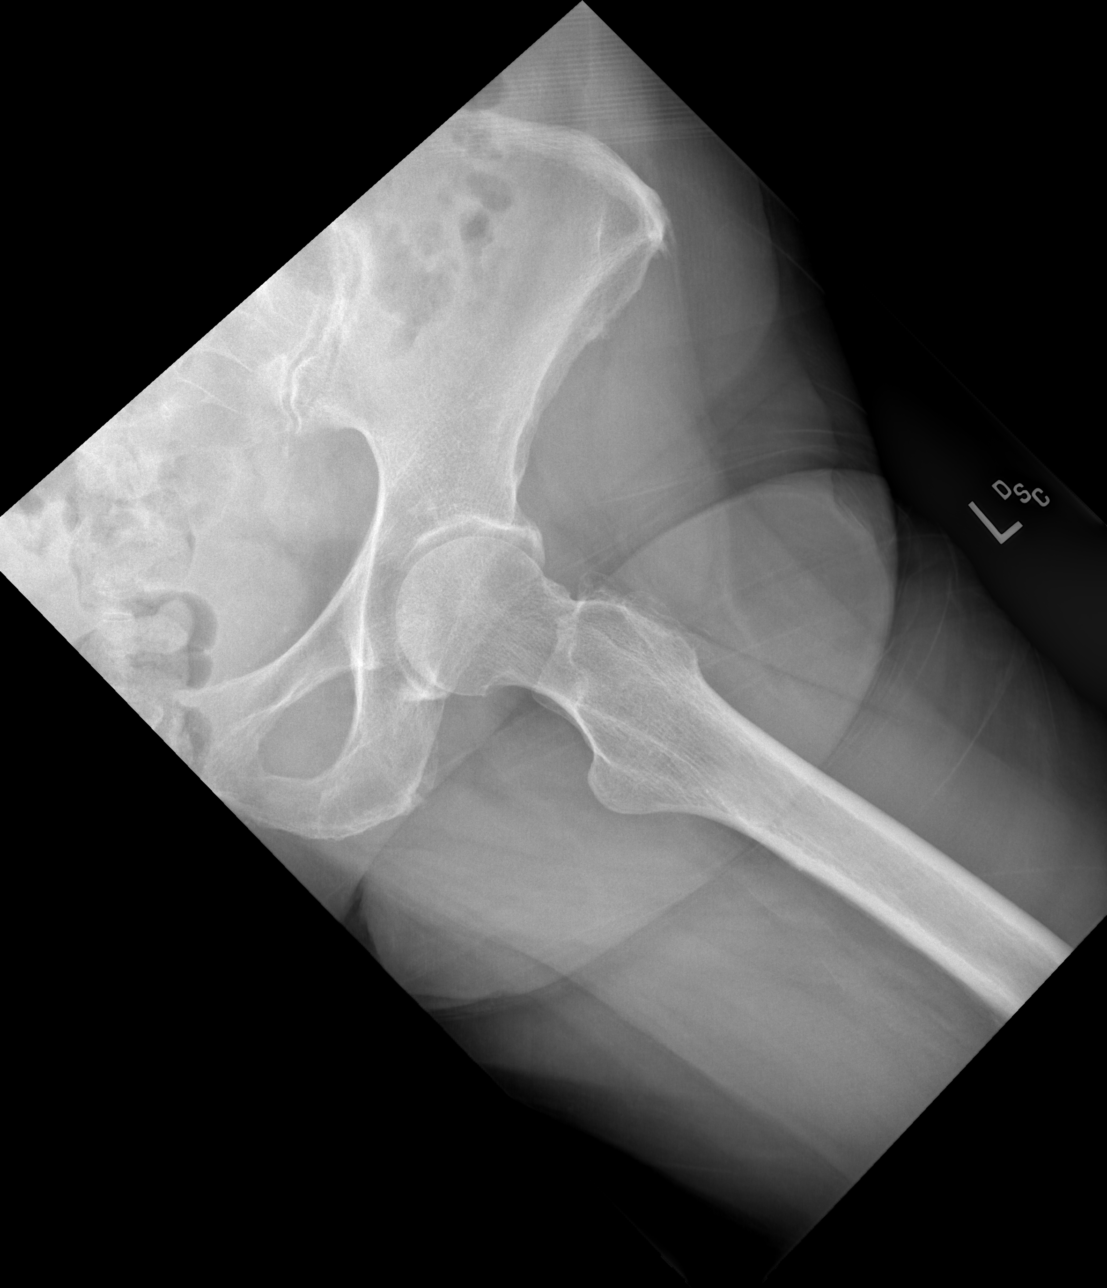

[3 of 3 positions shown; findings below may reference images not displayed]

FINDINGS: There is no evidence of hip fracture or dislocation. No significant
joint space narrowing is noted. Osteophyte formation is noted.
IMPRESSION: Moderate degenerative joint disease of left hip. No acute
abnormality seen.
